# Patient Record
Sex: Female | Born: 1960 | Race: White | Hispanic: No | Marital: Married | Smoking: Never smoker
Health system: Southern US, Community
[De-identification: ages and names within clinical notes are randomized; demographics above are authoritative.]

## PROBLEM LIST (undated history)

## (undated) DIAGNOSIS — T7840XA Allergy, unspecified, initial encounter: Secondary | ICD-10-CM

## (undated) DIAGNOSIS — G43909 Migraine, unspecified, not intractable, without status migrainosus: Secondary | ICD-10-CM

## (undated) DIAGNOSIS — E785 Hyperlipidemia, unspecified: Secondary | ICD-10-CM

## (undated) DIAGNOSIS — K219 Gastro-esophageal reflux disease without esophagitis: Secondary | ICD-10-CM

## (undated) HISTORY — DX: Gastro-esophageal reflux disease without esophagitis: K21.9

## (undated) HISTORY — DX: Hyperlipidemia, unspecified: E78.5

## (undated) HISTORY — DX: Migraine, unspecified, not intractable, without status migrainosus: G43.909

## (undated) HISTORY — DX: Allergy, unspecified, initial encounter: T78.40XA

## (undated) HISTORY — PX: DILATION AND CURETTAGE OF UTERUS: SHX78

## (undated) HISTORY — PX: ABDOMINAL HYSTERECTOMY: SHX81

---

## 2000-07-21 ENCOUNTER — Encounter: Admission: RE | Admit: 2000-07-21 | Discharge: 2000-10-19 | Payer: Self-pay | Admitting: Family Medicine

## 2000-09-22 ENCOUNTER — Other Ambulatory Visit: Admission: RE | Admit: 2000-09-22 | Discharge: 2000-09-22 | Payer: Self-pay | Admitting: Obstetrics and Gynecology

## 2001-06-19 ENCOUNTER — Encounter: Admission: RE | Admit: 2001-06-19 | Discharge: 2001-09-17 | Payer: Self-pay | Admitting: Family Medicine

## 2001-09-26 ENCOUNTER — Other Ambulatory Visit: Admission: RE | Admit: 2001-09-26 | Discharge: 2001-09-26 | Payer: Self-pay | Admitting: Obstetrics and Gynecology

## 2002-11-05 ENCOUNTER — Other Ambulatory Visit: Admission: RE | Admit: 2002-11-05 | Discharge: 2002-11-05 | Payer: Self-pay | Admitting: Obstetrics and Gynecology

## 2003-11-24 ENCOUNTER — Other Ambulatory Visit: Admission: RE | Admit: 2003-11-24 | Discharge: 2003-11-24 | Payer: Self-pay | Admitting: Obstetrics and Gynecology

## 2005-03-03 ENCOUNTER — Other Ambulatory Visit: Admission: RE | Admit: 2005-03-03 | Discharge: 2005-03-03 | Payer: Self-pay | Admitting: Obstetrics and Gynecology

## 2005-06-15 ENCOUNTER — Ambulatory Visit (HOSPITAL_COMMUNITY): Admission: RE | Admit: 2005-06-15 | Discharge: 2005-06-15 | Payer: Self-pay | Admitting: Gastroenterology

## 2008-04-01 ENCOUNTER — Ambulatory Visit (HOSPITAL_COMMUNITY): Admission: RE | Admit: 2008-04-01 | Discharge: 2008-04-01 | Payer: Self-pay | Admitting: Family Medicine

## 2008-05-16 ENCOUNTER — Ambulatory Visit: Payer: Self-pay | Admitting: Internal Medicine

## 2008-05-16 DIAGNOSIS — G43909 Migraine, unspecified, not intractable, without status migrainosus: Secondary | ICD-10-CM | POA: Insufficient documentation

## 2008-05-16 DIAGNOSIS — R0989 Other specified symptoms and signs involving the circulatory and respiratory systems: Secondary | ICD-10-CM

## 2008-05-16 DIAGNOSIS — R0609 Other forms of dyspnea: Secondary | ICD-10-CM | POA: Insufficient documentation

## 2011-01-05 ENCOUNTER — Other Ambulatory Visit: Payer: Self-pay | Admitting: Obstetrics and Gynecology

## 2011-01-05 DIAGNOSIS — R928 Other abnormal and inconclusive findings on diagnostic imaging of breast: Secondary | ICD-10-CM

## 2011-01-11 ENCOUNTER — Ambulatory Visit
Admission: RE | Admit: 2011-01-11 | Discharge: 2011-01-11 | Disposition: A | Discharge status: Home / Self Care | Payer: BC Managed Care – PPO | Source: Ambulatory Visit | Attending: Obstetrics and Gynecology | Admitting: Obstetrics and Gynecology

## 2011-01-11 DIAGNOSIS — R928 Other abnormal and inconclusive findings on diagnostic imaging of breast: Secondary | ICD-10-CM

## 2011-03-04 NOTE — Op Note (Signed)
[name], Erica Rojas                ACCOUNT NO.:  [id number]   MEDICAL RECORD NO.:  [id number]          PATIENT TYPE:  AMB   LOCATION:  ENDO                         FACILITY:  [facility]   PHYSICIAN:  Anselmo Rod, M.D.  DATE OF BIRTH:  1961/02/11   DATE OF PROCEDURE:  06/15/2005  DATE OF DISCHARGE:                                 OPERATIVE REPORT   PROCEDURE PERFORMED:  Screening colonoscopy.   ENDOSCOPIST:  Anselmo Rod, M.D.   INSTRUMENT USED:  Olympus video colonoscope.   INDICATIONS FOR PROCEDURE:  A 50 year old white female with a family history  of colon cancer undergoing screening colonoscopy to rule out colonic polyps,  masses, etc.   PREPROCEDURE PREPARATION:  Informed consent was procured from the patient.  The patient fasted for eight hours prior to the procedure and prepped with a  bottle of magnesium citrate and a gallon of GoLYTELY the night prior to the  procedure.  Risks and benefits of the procedure including a 10% miss rate of  cancer and polyps was discussed with the patient as well.   PREPROCEDURE PHYSICAL:  VITAL SIGNS:  Stable vital signs.  NECK:  Supple.  CHEST:  Clear to auscultation.  CARDIOVASCULAR:  S1 and S2 regular.  ABDOMEN:  Soft with normal bowel sounds.   DESCRIPTION OF PROCEDURE:  The patient was placed in left lateral decubitus  position, sedated with 50 mcg of Fentanyl and 5 mg of Versed in slow  incremental doses.  Once the patient was adequately sedated and maintained  on low flow oxygen and continuous cardiac monitoring, the Olympus video  colonoscope was advanced from the rectum to the cecum.  The appendiceal  orifice and ileocecal valve were clearly visualized and photographed.  No  masses, polyps, erosions or ulcerations or diverticula  were seen.  Small  internal hemorrhoids were seen on retroflexion in the rectum.  The patient  tolerated the procedure well without complications.   IMPRESSION:  Normal colonoscopy up to the terminal  ileum except for small  nonbleeding internal hemorrhoids seen on retroflexion.   RECOMMENDATIONS:  1.  Continue a high fiber diet with liberal fluid intake.  2.  Repeat colonoscopy in the next 10 years unless the patient develops any      abnormal symptoms in the interim.  3.  Outpatient follow-up as need arises in the future.      Anselmo Rod, M.D.  Electronically Signed     JNM/MEDQ  D:  06/15/2005  T:  06/15/2005  Job:  [id number]   cc:   Caryn Bee L. Little, M.D.  [address]  [place]  [place] [address]  Fax: [phone]   Guy Sandifer. Henderson Cloud, M.D.  [address]  [place]  [place] [address]  Fax: [phone]

## 2011-11-02 ENCOUNTER — Ambulatory Visit (INDEPENDENT_AMBULATORY_CARE_PROVIDER_SITE_OTHER): Payer: BC Managed Care – PPO

## 2011-11-02 DIAGNOSIS — J309 Allergic rhinitis, unspecified: Secondary | ICD-10-CM

## 2011-11-02 DIAGNOSIS — H9319 Tinnitus, unspecified ear: Secondary | ICD-10-CM

## 2014-03-20 ENCOUNTER — Ambulatory Visit (INDEPENDENT_AMBULATORY_CARE_PROVIDER_SITE_OTHER): Payer: BC Managed Care – PPO | Admitting: Internal Medicine

## 2014-03-20 ENCOUNTER — Encounter: Payer: Self-pay | Admitting: Internal Medicine

## 2014-03-20 VITALS — BP 118/80 | HR 91 | Temp 98.4°F | Resp 12 | Ht 58.5 in | Wt 167.0 lb

## 2014-03-20 DIAGNOSIS — E0590 Thyrotoxicosis, unspecified without thyrotoxic crisis or storm: Secondary | ICD-10-CM

## 2014-03-20 LAB — T4, FREE: Free T4: 1.55 ng/dL (ref 0.60–1.60)

## 2014-03-20 LAB — T3, FREE: T3 FREE: 3.9 pg/mL (ref 2.3–4.2)

## 2014-03-20 LAB — TSH: TSH: 0.01 u[IU]/mL — ABNORMAL LOW (ref 0.35–4.50)

## 2014-03-20 NOTE — Progress Notes (Signed)
Patient ID: Erica Rojas, female   DOB: [birth year], 53 y.o.   MRN: [id number]   HPI  Erica Rojas is a 53 y.o.-year-old female, referred by her PCP, Dr. Orland Penman, in consultation for thyrotoxycosis.  She was seen by Dr Gaetano Net (obGyn) 1 mo ago >> screening TSH was low. She followed up in 1 mo with him. She started to have sxs retrospectively: mm and joint pain (neck, shoulders), also confusion, lack of concentration >> became worse >> went to walk-in clinic >> given an inj of steroid >> pain better >> then got in to see PCP >> checked TSH >> still low >> referred to endocrinology.  I reviewed pt's thyroid tests - per records from PCP: 03/12/2014: TSH 0.02, fT4 1.59 (0.61-1.12), fT3 4.48 (2.5-3.9); ESR 15; CRP 2.6 02/17/2014: TSH 0.016  01/15/2013: TSH 1.462 Pt denies feeling nodules in neck, hoarseness, dysphagia/odynophagia, SOB with lying down; she c/o: - + excessive sweating/heat intolerance alternating with cold intolerance - + HA - + insomnia >> resolved - R side of neck painful and hot >> radiating to shoulder >> improved - + confusion >> resolved - no tremors - + anxiety - + palpitations - + fatigue - + hyperdefecation alternating with constipation - no weight changes  Pt does not have a FH of thyroid ds. No FH of thyroid cancer. No h/o radiation tx to head or neck.  No seaweed or kelp, no recent contrast studies. No steroid use. No herbal supplements.   ROS: Constitutional: see HPI Eyes: no blurry vision, no xerophthalmia ENT: + sore throat, no nodules palpated in throat, no dysphagia/odynophagia, no hoarseness Cardiovascular: no CP/SOB/palpitations/leg swelling Respiratory: no cough/SOB Gastrointestinal: no N/V/+D/+C Musculoskeletal: + muscle/joint aches Skin: no rashes, + easy bruising Neurological: no tremors/numbness/tingling/dizziness, + HA Psychiatric: + depression/anxiety  Past Medical History  Diagnosis Date  . Migraines   . Allergy     seasonal  . GERD  (gastroesophageal reflux disease)   . Hyperlipidemia    Past Surgical History  Procedure Laterality Date  . Abdominal hysterectomy    . Dilation and curettage of uterus      multiple  . Cesarean section     History   Occupational History  . Self-employed   Social History Main Topics  . Smoking status: Never Smoker   . Smokeless tobacco: Not on file  . Alcohol Use: 1.0 oz/week    2 drink(s) per week  . Drug Use: No   Social History Narrative   Married: 2 children; son and daughter (36 yrs, 42 yrs)    Current Outpatient Rx  Name  Route  Sig  Dispense  Refill  . aspirin 81 MG tablet   Oral   Take 81 mg by mouth daily.         . Biotin (BIOTIN 5000) 5 MG CAPS   Oral   Take 2 capsules by mouth at bedtime.         . Calcium Carbonate-Vit D-Min (CALCIUM 1200 PO)   Oral   Take 1 tablet by mouth daily.         . fexofenadine (ALLEGRA) 180 MG tablet   Oral   Take 180 mg by mouth as needed for allergies or rhinitis.         Marland Kitchen glucosamine-chondroitin 500-400 MG tablet   Oral   Take 2 tablets by mouth daily at 10 pm.         . montelukast (SINGULAIR) 10 MG tablet   Oral   Take 10 mg  by mouth as needed.         . Multiple Vitamins-Minerals (CENTRUM ULTRA WOMENS) TABS   Oral   Take 1 tablet by mouth daily at 10 pm.         . Omega-3 Fatty Acids (FISH OIL) 1000 MG CAPS   Oral   Take 2 capsules by mouth daily.         Marland Kitchen omeprazole (PRILOSEC) 20 MG capsule   Oral   Take 20 mg by mouth daily.         . SUMAtriptan-naproxen (TREXIMET) 85-500 MG per tablet   Oral   Take 1 tablet by mouth as needed for migraine.         . Vitamin D, Ergocalciferol, (DRISDOL) 50000 UNITS CAPS capsule   Oral   Take 50,000 Units by mouth every 7 (seven) days.          Allergies  Allergen Reactions  . Demerol [Meperidine]   . Meperidine Hcl   . Monosodium Glutamate   . Morphine   . Novocain [Procaine]    Family History  Problem Relation Age of Onset  .  Heart disease Mother   . Alcohol abuse Mother   . Depression Mother     and panic disorder  . Cancer Father     lung cancer  . Hypertension Father   . Heart disease Father   . Heart disease Paternal Grandmother   . Cancer Paternal Grandmother     breast cancer  . Cancer Paternal Grandfather    PE: BP 118/80  Pulse 91  Temp(Src) 98.4 F (36.9 C) (Oral)  Resp 12  Ht 4' 10.5" (1.486 m)  Wt 167 lb (75.751 kg)  BMI 34.30 kg/m2  SpO2 91% Wt Readings from Last 3 Encounters:  03/20/14 167 lb (75.751 kg)  05/16/08 184 lb (83.462 kg)   Constitutional: overweight, in NAD Eyes: PERRLA, EOMI, no exophthalmos, no lid lag, no stare ENT: moist mucous membranes, no thyromegaly, no thyroid bruits, no cervical lymphadenopathy Cardiovascular: RRR, No MRG Respiratory: CTA B Gastrointestinal: abdomen soft, NT, ND, BS+ Musculoskeletal: no deformities, strength intact in all 4 Skin: moist, warm, no rashes Neurological: no tremor with outstretched hands, DTR normal in all 4  ASSESSMENT: 1. Thyrotoxycosis  PLAN:  1. Patient with recently found thyrotoxicosis, with thyrotoxic sxs: weight loss, heat intolerance, hyperdefecation, palpitations, anxiety, which are improving. - she does not appear to have exogenous causes for the low TSH.  - We discussed that possible causes of thyrotoxicosis are:  Graves ds   Thyroiditis toxic multinodular goiter/ toxic adenoma (I cannot feel nodules at palpation of her thyroid). - I suggested that we check the TSH, fT3 and fT4 and also had thyroid stimulating antibodies to screen for Graves' disease.  - If the tests remain abnormal, we may need to obtain an uptake and scan to differentiate between the 3 above possible etiologies  - we discussed about possible modalities of treatment for the above conditions, to include methimazole use, radioactive iodine ablation or surgery. - we can also start provisional MMI 5 mg bid after results are back, depending on the  levels and she needs to stop MMI 4 days prior to the Uptake and scan - she agrees to plan. I explained possible SEs of MMI in case we start it. - I did advice her that we might need to do thyroid ultrasound depending on the results of the uptake and scan (if a cold nodule is present) - I do not  feel that we need to add beta blockers at this time, since she is not tachycardic, anxious, or tremulous - I advised her to join my chart to communicate easier - RTC in 3 months, but likely sooner for repeat labs  Office Visit on 03/20/2014  Component Date Value Ref Range Status  . TSH 03/20/2014 0.01* 0.35 - 4.50 uIU/mL Final  . Free T4 03/20/2014 1.55  0.60 - 1.60 ng/dL Final  . T3, Free 03/20/2014 3.9  2.3 - 4.2 pg/mL Final  . TSI 03/20/2014 118  <140 % baseline Final  TSI not elevated, unlikely Graves ds.  TSH still low, but free T3 and free T4 normalized >> will repeat TFTs in 6 weeks. This could have been an episode of mild thyroiditis.

## 2014-03-20 NOTE — Patient Instructions (Signed)
Please stop at the lab. Please return in 3 months for a visit.   Hyperthyroidism The thyroid is a large gland located in the lower front part of your neck. The thyroid helps control metabolism. Metabolism is how your body uses food. It controls metabolism with the hormone thyroxine. When the thyroid is overactive, it produces too much hormone. When this happens, these following problems may occur:   Nervousness  Heat intolerance  Weight loss (in spite of increase food intake)  Diarrhea  Change in hair or skin texture  Palpitations (heart skipping or having extra beats)  Tachycardia (rapid heart rate)  Loss of menstruation (amenorrhea)  Shaking of the hands CAUSES  Grave's Disease (the immune system attacks the thyroid gland). This is the most common cause.  Inflammation of the thyroid gland.  Tumor (usually benign) in the thyroid gland or elsewhere.  Excessive use of thyroid medications (both prescription and 'natural').  Excessive ingestion of Iodine. DIAGNOSIS  To prove hyperthyroidism, your caregiver may do blood tests and ultrasound tests. Sometimes the signs are hidden. It may be necessary for your caregiver to watch this illness with blood tests, either before or after diagnosis and treatment. TREATMENT Short-term treatment There are several treatments to control symptoms. Drugs called beta blockers may give some relief. Drugs that decrease hormone production will provide temporary relief in many people. These measures will usually not give permanent relief. Definitive therapy There are treatments available which can be discussed between you and your caregiver which will permanently treat the problem. These treatments range from surgery (removal of the thyroid), to the use of radioactive iodine (destroys the thyroid by radiation), to the use of antithyroid drugs (interfere with hormone synthesis). The first two treatments are permanent and usually successful. They most  often require hormone replacement therapy for life. This is because it is impossible to remove or destroy the exact amount of thyroid required to make a person euthyroid (normal). HOME CARE INSTRUCTIONS  See your caregiver if the problems you are being treated for get worse. Examples of this would be the problems listed above. SEEK MEDICAL CARE IF: Your general condition worsens. MAKE SURE YOU:   Understand these instructions.  Will watch your condition.  Will get help right away if you are not doing well or get worse. Document Released: 10/03/2005 Document Revised: 12/26/2011 Document Reviewed: 02/14/2007 [facility] Patient Information 2014 [place], [place].

## 2014-03-23 LAB — THYROID STIMULATING IMMUNOGLOBULIN: TSI: 118 % baseline (ref <140)

## 2014-06-20 ENCOUNTER — Other Ambulatory Visit (INDEPENDENT_AMBULATORY_CARE_PROVIDER_SITE_OTHER): Payer: BC Managed Care – PPO

## 2014-06-20 ENCOUNTER — Ambulatory Visit: Payer: BC Managed Care – PPO | Admitting: Internal Medicine

## 2014-06-20 DIAGNOSIS — E0590 Thyrotoxicosis, unspecified without thyrotoxic crisis or storm: Secondary | ICD-10-CM

## 2014-06-20 LAB — T4, FREE: Free T4: 1.15 ng/dL (ref 0.60–1.60)

## 2014-06-20 LAB — T3, FREE: T3, Free: 3.4 pg/mL (ref 2.3–4.2)

## 2014-06-20 LAB — TSH: TSH: 0.02 u[IU]/mL — AB (ref 0.35–4.50)

## 2014-06-24 ENCOUNTER — Ambulatory Visit (INDEPENDENT_AMBULATORY_CARE_PROVIDER_SITE_OTHER): Payer: BC Managed Care – PPO | Admitting: Internal Medicine

## 2014-06-24 ENCOUNTER — Encounter: Payer: Self-pay | Admitting: Internal Medicine

## 2014-06-24 ENCOUNTER — Other Ambulatory Visit: Payer: Self-pay | Admitting: Internal Medicine

## 2014-06-24 VITALS — BP 104/66 | HR 87 | Temp 98.3°F | Resp 12 | Wt 164.0 lb

## 2014-06-24 DIAGNOSIS — E0590 Thyrotoxicosis, unspecified without thyrotoxic crisis or storm: Secondary | ICD-10-CM

## 2014-06-24 NOTE — Progress Notes (Signed)
Patient ID: Syleena Mchan, female   DOB: 04-Jun-1961, 53 y.o.   MRN: [id number]   HPI  Klaudia Mura is a 53 y.o.-year-old female, returning for f/u for thyrotoxycosis. Last visit 3 mo ago.  Reviewed hx: She was seen by Dr Gaetano Net (obGyn) 1 mo ago >> screening TSH was low. She followed up in 1 mo with him. She started to have sxs retrospectively: mm and joint pain (neck, shoulders), also confusion, lack of concentration >> became worse >> went to walk-in clinic >> given an inj of steroid >> pain better >> then got in to see PCP >> checked TSH >> still low >> referred to endocrinology.  I reviewed pt's thyroid tests: Component     Latest Ref Rng 03/20/2014 06/20/2014  TSH     0.35 - 4.50 uIU/mL 0.01 (L) 0.02 (L)  Free T4     0.60 - 1.60 ng/dL 1.55 1.15  T3, Free     2.3 - 4.2 pg/mL 3.9 3.4  TSI     <140 % baseline 118    03/12/2014: TSH 0.02, fT4 1.59 (0.61-1.12), fT3 4.48 (2.5-3.9); ESR 15; CRP 2.6 02/17/2014: TSH 0.016  01/15/2013: TSH 1.462  Pt denies feeling nodules in neck, hoarseness, dysphagia/odynophagia, SOB with lying down; she does not have many sxs that can be related to her subclinical hyperthyroidism - she c/o: - + some heat intolerance >> but improved - + HA - resolved insomnia >> resolved - R side of neck painful and hot >> radiating to shoulder >> improved - resolved confusion - no tremors - + anxiety - resolved  palpitations - no fatigue - + occasional constipation - no weight changes  Pt does not have a FH of thyroid ds. No FH of thyroid cancer. No h/o radiation tx to head or neck.   I reviewed pt's medications, allergies, PMH, social hx, family hx and no changes required, except as mentioned above. She started Ergocalciferol 50,000 units weekly.  ROS: Constitutional: see HPI, + nocturia Eyes: no blurry vision, no xerophthalmia ENT: no sore throat, no nodules palpated in throat, no dysphagia/odynophagia, no hoarseness Cardiovascular: no CP/SOB/palpitations/leg  swelling Respiratory: no cough/SOB Gastrointestinal: no N/V/D/+C Musculoskeletal: no muscle/joint aches Skin: no rashes, + easy bruising Neurological: no tremors/numbness/tingling/dizziness, HA  PE: BP 104/66  Pulse 87  Temp(Src) 98.3 F (36.8 C) (Oral)  Resp 12  Wt 164 lb (74.39 kg)  SpO2 98% Wt Readings from Last 3 Encounters:  06/24/14 164 lb (74.39 kg)  03/20/14 167 lb (75.751 kg)  05/16/08 184 lb (83.462 kg)   Constitutional: overweight, in NAD Eyes: PERRLA, EOMI, no exophthalmos, no lid lag, no stare ENT: moist mucous membranes, no thyromegaly, no thyroid bruits, no cervical lymphadenopathy Cardiovascular: RRR, No MRG Respiratory: CTA B Gastrointestinal: abdomen soft, NT, ND, BS+ Musculoskeletal: no deformities, strength intact in all 4 Skin: moist, warm, no rashes Neurological: no tremor with outstretched hands, DTR normal in all 4  ASSESSMENT: 1. Thyrotoxycosis  PLAN:  1. Patient with recently found thyrotoxicosis, without thyrotoxic sxs now, but with persistent low TSH after 3 months. - she does not appear to have exogenous causes for the low TSH.  - We again discussed that possible causes of thyrotoxicosis are:  Graves ds   Thyroiditis toxic multinodular goiter/ toxic adenoma (I cannot feel nodules at palpation of her thyroid).  - since the tests remained abnormal, we will obtain an uptake and scan to differentiate between the 3 above possible etiologies. I explained what the test entails. - I  do not feel that we need to add beta blockers at this time, since she is not tachycardic, anxious, or tremulous - RTC in 3 months for labs but in 6 mo for another visit   CLINICAL DATA: Thyrotoxicosis  EXAM: THYROID SCAN AND UPTAKE - 24 HOURS  TECHNIQUE: Following the per oral administration of I-131 sodium iodide, the patient returned at 24 hours and uptake measurements were acquired with the uptake probe centered on the neck. Thyroid imaging was performed  following the intravenous administration of the Tc-37m Pertechnetate.  RADIOPHARMACEUTICALS: 7.5 microCuries I-131 Sodium Iodide and 10.8 mCi TC-39m Pertechnetate  COMPARISON: None  FINDINGS: The 24 hr radioactive iodine uptake is equal to 29.3%  There is diffuse radiotracer uptake throughout both lobes of the thyroid gland. No dominant hot or cold nodules identified.  IMPRESSION: 1. Mildly elevated 24 hr radioactive iodine uptake. 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  2. Normal thyroid scan.   Electronically Signed By: Kerby Moors M.D. On: 07/08/2014 09:30     Very mild thyroid hyperactivity >> will continue to monitor. Check labs in 3 months as discussed.

## 2014-06-24 NOTE — Patient Instructions (Signed)
Please come back for labs in 3 months and for a visit in 6 months. We will schedule the Uptake and scan and will call you with the schedule.

## 2014-06-27 ENCOUNTER — Telehealth: Payer: Self-pay | Admitting: *Deleted

## 2014-06-27 NOTE — Telephone Encounter (Signed)
Uptake and scan scheduled for Monday, Sept 21st at 8:00 am (arrival time 7:45 am) and Tuesday, Sept 22nd at 8:00 am (arrival time 7:45 am). Called pt and lvm advising her of the dates and times. Advised pt to call with any questions. Letter mailed to pt as well.

## 2014-07-07 ENCOUNTER — Encounter (HOSPITAL_COMMUNITY)
Admission: RE | Admit: 2014-07-07 | Discharge: 2014-07-07 | Disposition: A | Discharge status: Home / Self Care | Payer: BC Managed Care – PPO | Source: Ambulatory Visit | Attending: Internal Medicine | Admitting: Internal Medicine

## 2014-07-07 DIAGNOSIS — E0590 Thyrotoxicosis, unspecified without thyrotoxic crisis or storm: Secondary | ICD-10-CM | POA: Insufficient documentation

## 2014-07-08 ENCOUNTER — Encounter (HOSPITAL_COMMUNITY)
Admission: RE | Admit: 2014-07-08 | Discharge: 2014-07-08 | Disposition: A | Discharge status: Home / Self Care | Payer: BC Managed Care – PPO | Source: Ambulatory Visit | Attending: Internal Medicine | Admitting: Internal Medicine

## 2014-07-08 DIAGNOSIS — E0590 Thyrotoxicosis, unspecified without thyrotoxic crisis or storm: Secondary | ICD-10-CM | POA: Diagnosis not present

## 2014-07-08 MED ORDER — SODIUM PERTECHNETATE TC 99M INJECTION
10.8000 | Freq: Once | INTRAVENOUS | Status: AC | PRN
Start: 2014-07-08 — End: 2014-07-08
  Administered 2014-07-08: 11 via INTRAVENOUS

## 2014-07-08 MED ORDER — SODIUM IODIDE I 131 CAPSULE
7.3000 | Freq: Once | INTRAVENOUS | Status: AC
  Administered 2014-07-07: 7.3 via ORAL

## 2014-09-23 ENCOUNTER — Ambulatory Visit (INDEPENDENT_AMBULATORY_CARE_PROVIDER_SITE_OTHER): Payer: BC Managed Care – PPO | Admitting: Internal Medicine

## 2014-09-23 ENCOUNTER — Encounter: Payer: Self-pay | Admitting: Internal Medicine

## 2014-09-23 VITALS — BP 122/72 | HR 76 | Temp 98.1°F | Resp 12 | Wt 162.8 lb

## 2014-09-23 DIAGNOSIS — E0590 Thyrotoxicosis, unspecified without thyrotoxic crisis or storm: Secondary | ICD-10-CM

## 2014-09-23 LAB — T4, FREE: FREE T4: 1.2 ng/dL (ref 0.60–1.60)

## 2014-09-23 LAB — T3, FREE: T3, Free: 4.2 pg/mL (ref 2.3–4.2)

## 2014-09-23 LAB — TSH: TSH: 0.03 u[IU]/mL — ABNORMAL LOW (ref 0.35–4.50)

## 2014-09-23 NOTE — Progress Notes (Signed)
Patient ID: Erica Rojas, female   DOB: 1961/08/30, 53 y.o.   MRN: [id number]   HPI  Heimdal is a 53 y.o.-year-old female, returning for f/u for thyrotoxicosis. Last visit 3 mo ago.  Reviewed hx: She was seen by Dr Gaetano Net (obGyn) 1 mo ago >> screening TSH was low. She followed up in 1 mo with him. She started to have sxs retrospectively: mm and joint pain (neck, shoulders), also confusion, lack of concentration >> became worse >> went to walk-in clinic >> given an inj of steroid >> pain better >> then got in to see PCP >> checked TSH >> still low >> referred to endocrinology.  I reviewed pt's thyroid tests: Component     Latest Ref Rng 03/20/2014 06/20/2014  TSH     0.35 - 4.50 uIU/mL 0.01 (L) 0.02 (L)  Free T4     0.60 - 1.60 ng/dL 1.55 1.15  T3, Free     2.3 - 4.2 pg/mL 3.9 3.4  TSI     <140 % baseline 118    03/12/2014: TSH 0.02, fT4 1.59 (0.61-1.12), fT3 4.48 (2.5-3.9); ESR 15; CRP 2.6 02/17/2014: TSH 0.016  01/15/2013: TSH 1.462  Uptake and scan 07/08/2014: 29.3% uptake, uniform  Pt denies feeling nodules in neck, hoarseness, dysphagia/odynophagia, SOB with lying down; she does not have many sxs that can be related to her subclinical hyperthyroidism - she c/o: - + some heat intolerance (hysterectomy 2014), does not feel like these are hot flushes - resolved HA - resolved insomnia  - resolved R side of neck painful and hot >> radiating to shoulder  - no tremors - + anxiety - resolved  palpitations - no fatigue - + occasional constipation - no weight changes  I reviewed pt's medications, allergies, PMH, social hx, family hx and no changes required, except as mentioned above. She started 2x5000 IU a day OTC vitamin D.   She stopped yoga/exercise as she has been very stressed >> daughter attempted suicide in 06/2014. She is now better, lives with pt.  ROS: Constitutional: see HPI, + nocturia Eyes: no blurry vision, no xerophthalmia ENT: no sore throat, no nodules  palpated in throat, no dysphagia/odynophagia, no hoarseness Cardiovascular: no CP/SOB/palpitations/leg swelling Respiratory: no cough/SOB Gastrointestinal: no N/V/D/+C/+ heartburn Musculoskeletal: no muscle/joint aches Skin: no rashes, + easy bruising Neurological: no tremors/numbness/tingling/dizziness, HA  PE: BP 122/72 mmHg  Pulse 76  Temp(Src) 98.1 F (36.7 C) (Oral)  Resp 12  Wt 162 lb 12.8 oz (73.846 kg)  SpO2 97% Body mass index is 33.44 kg/(m^2).  Wt Readings from Last 3 Encounters:  09/23/14 162 lb 12.8 oz (73.846 kg)  06/24/14 164 lb (74.39 kg)  03/20/14 167 lb (75.751 kg)   Constitutional: overweight, in NAD Eyes: PERRLA, EOMI, no exophthalmos, no lid lag, no stare ENT: moist mucous membranes, no thyromegaly, no cervical lymphadenopathy Cardiovascular: RRR, No MRG Respiratory: CTA B Gastrointestinal: abdomen soft, NT, ND, BS+ Musculoskeletal: no deformities, strength intact in all 4 Skin: moist, warm, no rashes Neurological: no tremor with outstretched hands, DTR normal in all 4  ASSESSMENT: 1. Graves disease - mild  PLAN:  1. Patient h/o mild thyrotoxicosis, without thyrotoxic sxs now except slight hyperthermia, and with persistent low TSH. Free thyroid hormones are normal. - we will check the labs today and see if we need to start [place] when the labs are back. If same or better, we will just follow them. - we need to add beta blockers at this time, since she  is not tachycardic, anxious, or tremulous - RTC in 6 mo for another visit   Office Visit on 09/23/2014  Component Date Value Ref Range Status  . T3, Free 09/23/2014 4.2  2.3 - 4.2 pg/mL Final  . Free T4 09/23/2014 1.20  0.60 - 1.60 ng/dL Final  . TSH 09/23/2014 0.03* 0.35 - 4.50 uIU/mL Final   TSH inching up. Recheck tests in 6 months. Pt was advised about sxs of hyperthyroidism and will call me if she develops them so we can repeat the thyroid tests.

## 2014-09-23 NOTE — Patient Instructions (Signed)
Please stop at the lab.  Please come back for a follow-up appointment in 6 months.  

## 2015-03-06 ENCOUNTER — Encounter: Payer: Self-pay | Admitting: Internal Medicine

## 2015-03-06 ENCOUNTER — Other Ambulatory Visit: Payer: Self-pay | Admitting: *Deleted

## 2015-03-06 DIAGNOSIS — E0590 Thyrotoxicosis, unspecified without thyrotoxic crisis or storm: Secondary | ICD-10-CM

## 2015-03-25 ENCOUNTER — Encounter: Payer: Self-pay | Admitting: Internal Medicine

## 2015-03-25 ENCOUNTER — Ambulatory Visit (INDEPENDENT_AMBULATORY_CARE_PROVIDER_SITE_OTHER): Payer: BLUE CROSS/BLUE SHIELD | Admitting: Internal Medicine

## 2015-03-25 VITALS — BP 108/62 | HR 100 | Temp 98.0°F | Resp 12 | Wt 164.0 lb

## 2015-03-25 DIAGNOSIS — E0590 Thyrotoxicosis, unspecified without thyrotoxic crisis or storm: Secondary | ICD-10-CM | POA: Diagnosis not present

## 2015-03-25 DIAGNOSIS — R351 Nocturia: Secondary | ICD-10-CM

## 2015-03-25 NOTE — Patient Instructions (Signed)
Please return in 10 days for labs. Please come back for a follow-up appointment in 4 months.

## 2015-03-25 NOTE — Progress Notes (Addendum)
Patient ID: Erica Rojas, female   DOB: [birth year], 54 y.o.   MRN: [id number]   HPI  Erica Rojas is a 54 y.o.-year-old female, returning for f/u for thyrotoxicosis. Last visit 6 mo ago. She did not return for labs earlier.  Please had poison oak allergy rxn in 02/2015. She got a steroid inj followed by a taper.   She then had a hair follicular abscess >> ABx.  Reviewed hx: She was seen by Dr Erica Rojas (obGyn) 1 mo ago >> screening TSH was low. She followed up in 1 mo with him. She started to have sxs retrospectively: mm and joint pain (neck, shoulders), also confusion, lack of concentration >> became worse >> went to walk-in clinic >> given an inj of steroid >> pain better >> then got in to see PCP >> checked TSH >> still low >> referred to endocrinology.  I reviewed pt's thyroid tests: Component     Latest Ref Rng 03/20/2014 06/20/2014 09/23/2014  TSH     0.35 - 4.50 uIU/mL 0.01 (L) 0.02 (L) 0.03 (L)  Free T4     0.60 - 1.60 ng/dL 1.55 1.15 1.20  T3, Free     2.3 - 4.2 pg/mL 3.9 3.4 4.2  TSI     <140 % baseline 118     03/12/2014: TSH 0.02, fT4 1.59 (0.61-1.12), fT3 4.48 (2.5-3.9); ESR 15; CRP 2.6 02/17/2014: TSH 0.016  01/15/2013: TSH 1.462  Uptake and scan 07/08/2014: 29.3% uptake (nl 10-30%), uniform  Pt denies feeling nodules in neck, hoarseness, dysphagia/odynophagia, SOB with lying down.  She was on Biotin, now off x 4 mo.  She c/o: - + some heat intolerance (hysterectomy 2014), does not feel like these are hot flushes - + nocturia x4 - + heat intolerance. - resolved tremors - no tremors - + anxiety - + palpitations (especially on Prednisone taper for the poison oak. - no fatigue - + easy bruising - no weight changes  I reviewed pt's medications, allergies, PMH, social hx, family hx, and changes were documented in the history of present illness. Otherwise, unchanged from my initial visit note.  Daughter attempted suicide in 06/2014. She is now better, lives with  pt.  ROS: Constitutional: see HPI, + nocturia Eyes: no blurry vision, no xerophthalmia ENT: no sore throat, no nodules palpated in throat, no dysphagia/odynophagia, no hoarseness Cardiovascular: no CP/SOB/palpitations/leg swelling Respiratory: no cough/SOB Gastrointestinal: no N/V/D/C/+ heartburn Musculoskeletal: no muscle/joint aches Skin: no rashes, + easy bruising Neurological: no tremors/numbness/tingling/dizziness, HA  PE: BP 108/62 mmHg  Pulse 100  Temp(Src) 98 F (36.7 C) (Oral)  Resp 12  Wt 164 lb (74.39 kg)  SpO2 98% Body mass index is 33.69 kg/(m^2).  Wt Readings from Last 3 Encounters:  03/25/15 164 lb (74.39 kg)  09/23/14 162 lb 12.8 oz (73.846 kg)  06/24/14 164 lb (74.39 kg)   Constitutional: overweight, in NAD Eyes: PERRLA, EOMI, no exophthalmos, no lid lag, no stare ENT: moist mucous membranes, no thyromegaly, no cervical lymphadenopathy Cardiovascular: tachycardia, RR, No MRG Respiratory: CTA B Gastrointestinal: abdomen soft, NT, ND, BS+ Musculoskeletal: no deformities, strength intact in all 4 Skin: moist, warm, no rashes Neurological: no tremor with outstretched hands, DTR normal in all 4  ASSESSMENT: 1. Low TSH - ? Graves disease  2. Nocturia  PLAN:  1. Patient h/o mild thyrotoxicosis, with some thyrotoxic sxs :hyperthermia, occasional palpitations and tremors. She has anxiety >> not new. Pulse elevated today. Free thyroid hormones are normal. - we will check the  labs in 10 days (after she recovers from Prednisone dosing,  and see if we need to start Andrews when the labs are back. If same or better, we will just follow them. - RTC in 4 mo for another visit   2. Nocturia - will check a HbA1c with next labs  Component     Latest Ref Rng 03/20/2014 06/20/2014 09/23/2014 04/03/2015  TSH     0.35 - 4.50 uIU/mL 0.01 (L) 0.02 (L) 0.03 (L) 0.06 (L)  Free T4     0.60 - 1.60 ng/dL 1.55 1.15 1.20 1.18  Hemoglobin A1C     4.6 - 6.5 %    5.6   Hemoglobin A1c  normal, no signs of prediabetes or diabetes. TSH and free T4 continue to improve, despite a recent prednisone taper.  We'll continue to monitor for now off methimazole, especially in the setting of a normal uptake and scan. I will recheck her thyroid tests in 3 months, when she returns for another visit.

## 2015-04-01 ENCOUNTER — Other Ambulatory Visit: Payer: Self-pay | Admitting: Obstetrics and Gynecology

## 2015-04-01 DIAGNOSIS — R928 Other abnormal and inconclusive findings on diagnostic imaging of breast: Secondary | ICD-10-CM

## 2015-04-03 ENCOUNTER — Other Ambulatory Visit (INDEPENDENT_AMBULATORY_CARE_PROVIDER_SITE_OTHER): Payer: BLUE CROSS/BLUE SHIELD

## 2015-04-03 DIAGNOSIS — R351 Nocturia: Secondary | ICD-10-CM | POA: Diagnosis not present

## 2015-04-03 DIAGNOSIS — E0590 Thyrotoxicosis, unspecified without thyrotoxic crisis or storm: Secondary | ICD-10-CM

## 2015-04-03 LAB — TSH: TSH: 0.06 u[IU]/mL — ABNORMAL LOW (ref 0.35–4.50)

## 2015-04-03 LAB — T4, FREE: Free T4: 1.18 ng/dL (ref 0.60–1.60)

## 2015-04-03 LAB — HEMOGLOBIN A1C: Hgb A1c MFr Bld: 5.6 % (ref 4.6–6.5)

## 2015-04-07 ENCOUNTER — Ambulatory Visit
Admission: RE | Admit: 2015-04-07 | Discharge: 2015-04-07 | Disposition: A | Discharge status: Home / Self Care | Payer: BLUE CROSS/BLUE SHIELD | Source: Ambulatory Visit | Attending: Obstetrics and Gynecology | Admitting: Obstetrics and Gynecology

## 2015-04-07 DIAGNOSIS — R928 Other abnormal and inconclusive findings on diagnostic imaging of breast: Secondary | ICD-10-CM

## 2015-04-22 NOTE — Addendum Note (Signed)
Addended by: Carlus PavlovGHERGHE, Suad Autrey on: 04/22/2015 07:44 AM   Modules accepted: Level of Service

## 2015-08-07 ENCOUNTER — Ambulatory Visit (INDEPENDENT_AMBULATORY_CARE_PROVIDER_SITE_OTHER): Payer: BLUE CROSS/BLUE SHIELD | Admitting: Internal Medicine

## 2015-08-07 ENCOUNTER — Other Ambulatory Visit (INDEPENDENT_AMBULATORY_CARE_PROVIDER_SITE_OTHER): Payer: BLUE CROSS/BLUE SHIELD | Admitting: *Deleted

## 2015-08-07 ENCOUNTER — Ambulatory Visit: Payer: BLUE CROSS/BLUE SHIELD | Admitting: Internal Medicine

## 2015-08-07 ENCOUNTER — Encounter: Payer: Self-pay | Admitting: Internal Medicine

## 2015-08-07 VITALS — BP 118/72 | HR 90 | Temp 98.4°F | Resp 12 | Wt 166.0 lb

## 2015-08-07 DIAGNOSIS — E041 Nontoxic single thyroid nodule: Secondary | ICD-10-CM | POA: Diagnosis not present

## 2015-08-07 DIAGNOSIS — Z23 Encounter for immunization: Secondary | ICD-10-CM

## 2015-08-07 DIAGNOSIS — E0590 Thyrotoxicosis, unspecified without thyrotoxic crisis or storm: Secondary | ICD-10-CM | POA: Diagnosis not present

## 2015-08-07 LAB — T4, FREE: FREE T4: 1.2 ng/dL (ref 0.60–1.60)

## 2015-08-07 LAB — TSH: TSH: 0.07 u[IU]/mL — AB (ref 0.35–4.50)

## 2015-08-07 LAB — T3, FREE: T3 FREE: 3.8 pg/mL (ref 2.3–4.2)

## 2015-08-07 NOTE — Patient Instructions (Signed)
Please stop at the lab.  We will call you with the Ultrasound schedule.  Please come back for a follow-up appointment in 6 months.

## 2015-08-07 NOTE — Progress Notes (Addendum)
Patient ID: Erica Rojas, female   DOB: 1961/02/27, 54 y.o.   MRN: [id number]   HPI  Erica Rojas is a 54 y.o.-year-old female, returning for f/u for subclinical thyrotoxicosis. Last visit 6 mo ago.   Reviewed hx: She was seen by Dr Erica Rojas (obGyn) 1 mo ago >> screening TSH was low. She followed up in 1 mo with him. She started to have sxs retrospectively: mm and joint pain (neck, shoulders), also confusion, lack of concentration >> became worse >> went to walk-in clinic >> given an inj of steroid >> pain better >> then got in to see PCP >> checked TSH >> still low >> referred to endocrinology.  I reviewed pt's thyroid tests: Component     Latest Ref Rng 03/20/2014 06/20/2014 09/23/2014 04/03/2015  TSH     0.35 - 4.50 uIU/mL 0.01 (L) 0.02 (L) 0.03 (L) 0.06 (L)  Free T4     0.60 - 1.60 ng/dL 1.55 1.15 1.20 1.18  Hemoglobin A1C     4.6 - 6.5 %    5.6   03/12/2014: TSH 0.02, fT4 1.59 (0.61-1.12), fT3 4.48 (2.5-3.9); ESR 15; CRP 2.6 02/17/2014: TSH 0.016  01/15/2013: TSH 1.462  Uptake and scan 07/08/2014: 29.3% uptake (nl 10-30%), uniform  Pt denies feeling nodules in neck, hoarseness, dysphagia/odynophagia, SOB with lying down.  She was on Biotin, now off.  She c/o: - + some heat intolerance (hysterectomy 2014), mostly at night >> sweating, also - resolved tremors - no tremors - improved anxiety - no palpitations  - no fatigue - no easy bruising - no weight changes  I reviewed pt's medications, allergies, PMH, social hx, family hx, and changes were documented in the history of present illness. Otherwise, unchanged from my initial visit note.  Daughter attempted suicide in 06/2014. She is now better, lives with pt.  ROS: Constitutional: see HPI, + nocturia x2 Eyes: no blurry vision, no xerophthalmia ENT: no sore throat, no nodules palpated in throat, no dysphagia/odynophagia, no hoarseness Cardiovascular: no CP/SOB/palpitations/leg swelling Respiratory: no  cough/SOB Gastrointestinal: no N/V/D/no C/+ heartburn Musculoskeletal: no muscle/joint aches Skin: no rashes, no easy bruising Neurological: no tremors/numbness/tingling/dizziness, HA  PE: BP 118/72 mmHg  Pulse 90  Temp(Src) 98.4 F (36.9 C) (Oral)  Resp 12  Wt 166 lb (75.297 kg)  SpO2 95% Body mass index is 34.1 kg/(m^2).  Wt Readings from Last 3 Encounters:  08/07/15 166 lb (75.297 kg)  03/25/15 164 lb (74.39 kg)  09/23/14 162 lb 12.8 oz (73.846 kg)   Constitutional: overweight, in NAD Eyes: PERRLA, EOMI, no exophthalmos, no lid lag, no stare ENT: moist mucous membranes, R > L thyroid fullness (? Thyroid nodule), no cervical lymphadenopathy Cardiovascular: RRR, No MRG Respiratory: CTA B Gastrointestinal: abdomen soft, NT, ND, BS+ Musculoskeletal: no deformities, strength intact in all 4 Skin: moist, warm, no rashes Neurological: no tremor with outstretched hands, DTR normal in all 4  ASSESSMENT: 1. Subclinical hyperthyroidism - 07/08/2014: Upper normal thyroid uptake (29.3%) and uniform scan. - likely very mild Graves disease  2. Left thyroid fullness - ? Thyroid nodule  PLAN:  1. Patient h/o mild thyrotoxicosis, with some thyrotoxic sxs :hyperthermia, occasional palpitations and tremors, now all resolved, except hot flashes at night. She had anxiety >> resolved. - I reviewed previous TFTs along with the patient: Free thyroid hormones are normal, while the TSH is slowly improving but still low. Will recheck TFTs today - we held off adding methimazole, based on the nonspecific symptoms, improving thyroid tests, and  normal uptake and scan. - At this visit, she reports feeling better, so no need for methimazole, but will await the results of the TFTs. - RTC in 6 mo for another visit   2. Left thyroid fullness - On palpation, I feel a possible thyroid nodule in her left lobe. This is not hot or cold per review of the uptake and scan in 06/2014. - No neck compression  symptoms - Will check a thyroid ultrasound. We discussed about the possibility of obtaining a thyroid biopsy if she has a nodule. She agrees with this.    Orders Placed This Encounter  Procedures  . US Soft Tissue Head/Neck  . TSH  . T4, free  . T3, free   Office Visit on 08/07/2015  Component Date Value Ref Range Status  . TSH 08/07/2015 0.07* 0.35 - 4.50 uIU/mL Final  . Free T4 08/07/2015 1.20  0.60 - 1.60 ng/dL Final  . T3, Free 08/07/2015 3.8  2.3 - 4.2 pg/mL Final   Message sent: Dear Ms Erica Rojas, The TSH is only slightly better compared to last time. The rest of the labs are normal. Let's continue to keep an eye on the thyroid tests, but no intervention is needed for now, as we discussed today. Sincerely, Erica Kingdom MD   CLINICAL DATA: 54 year old female with a history of left thyroid nodules  EXAM: THYROID ULTRASOUND  TECHNIQUE: Ultrasound examination of the thyroid gland and adjacent soft tissues was performed.  COMPARISON: Nuclear medicine study 07/08/2014  FINDINGS: Right thyroid lobe  Measurements: 3.7 cm x 1.3 cm x 1.6 cm. Heterogeneous appearance of the right thyroid tissue without significantly increased flow.  Left thyroid lobe  Measurements: 3.3 cm x 1.5 cm x 1.6 cm. Heterogeneous appearance of left thyroid tissue without significantly increased flow.  Isthmus  Thickness: 2 mm. No nodules visualized.  Lymphadenopathy  None visualized.  IMPRESSION: Heterogeneous appearance of the thyroid without focal nodules. Appearance may represent spectrum of thyroiditis.  Signed,  Erica Rojas. Erica Newport, DO  [facility]  Erica Rojas   Electronically Signed By: Erica Rojas D.O. On: 08/14/2015 13:34  No thyroid nodules.

## 2015-08-14 ENCOUNTER — Ambulatory Visit
Admission: RE | Admit: 2015-08-14 | Discharge: 2015-08-14 | Disposition: A | Discharge status: Home / Self Care | Payer: BLUE CROSS/BLUE SHIELD | Source: Ambulatory Visit | Attending: Internal Medicine | Admitting: Internal Medicine

## 2015-09-01 ENCOUNTER — Other Ambulatory Visit: Payer: Self-pay | Admitting: Obstetrics and Gynecology

## 2015-09-01 DIAGNOSIS — R921 Mammographic calcification found on diagnostic imaging of breast: Secondary | ICD-10-CM

## 2015-10-07 ENCOUNTER — Ambulatory Visit
Admission: RE | Admit: 2015-10-07 | Discharge: 2015-10-07 | Disposition: A | Discharge status: Home / Self Care | Payer: BLUE CROSS/BLUE SHIELD | Source: Ambulatory Visit | Attending: Obstetrics and Gynecology | Admitting: Obstetrics and Gynecology

## 2015-10-07 ENCOUNTER — Other Ambulatory Visit: Payer: Self-pay | Admitting: Obstetrics and Gynecology

## 2015-10-07 DIAGNOSIS — R921 Mammographic calcification found on diagnostic imaging of breast: Secondary | ICD-10-CM

## 2016-02-05 ENCOUNTER — Encounter: Payer: Self-pay | Admitting: Internal Medicine

## 2016-02-05 ENCOUNTER — Ambulatory Visit (INDEPENDENT_AMBULATORY_CARE_PROVIDER_SITE_OTHER): Payer: BLUE CROSS/BLUE SHIELD | Admitting: Internal Medicine

## 2016-02-05 VITALS — BP 108/72 | HR 87 | Temp 98.3°F | Resp 12 | Wt 171.0 lb

## 2016-02-05 DIAGNOSIS — E0789 Other specified disorders of thyroid: Secondary | ICD-10-CM

## 2016-02-05 DIAGNOSIS — R233 Spontaneous ecchymoses: Secondary | ICD-10-CM

## 2016-02-05 DIAGNOSIS — E0590 Thyrotoxicosis, unspecified without thyrotoxic crisis or storm: Secondary | ICD-10-CM | POA: Insufficient documentation

## 2016-02-05 DIAGNOSIS — R238 Other skin changes: Secondary | ICD-10-CM | POA: Diagnosis not present

## 2016-02-05 LAB — T3, FREE: T3 FREE: 4 pg/mL (ref 2.3–4.2)

## 2016-02-05 LAB — TSH: TSH: 0.08 u[IU]/mL — ABNORMAL LOW (ref 0.35–4.50)

## 2016-02-05 LAB — T4, FREE: FREE T4: 1.16 ng/dL (ref 0.60–1.60)

## 2016-02-05 NOTE — Patient Instructions (Signed)
Please stop at the lab.  Please return in 1 year.  

## 2016-02-05 NOTE — Progress Notes (Signed)
Patient ID: Erica Rojas, female   DOB: [birth year], 55 y.o.   MRN: [id number]   HPI  Erica Rojas is a 55 y.o.-year-old female, returning for f/u for subclinical thyrotoxicosis. Last visit 6 mo ago.   Reviewed hx: She was seen by Dr Henderson Cloud (obGyn) >> screening TSH was low. Retrospectively, she had: mm and joint pain (neck, shoulders), also confusion, lack of concentration >> became worse >> went to walk-in clinic >> given an inj of steroid >> pain better >> then got in to see PCP >> checked TSH >> still low >> referred to endocrinology (03/2015).  Since then, TFTs remain abnormal, however, her symptoms resolved. A thyroid uptake and scan was normal, as was a thyroid ultrasound, except heterogeneous appearance of her thyroid lobes.  I reviewed pt's thyroid tests: Component     Latest Ref Rng 08/07/2015  TSH     0.35 - 4.50 uIU/mL 0.07 (L)  T4,Free(Direct)     0.60 - 1.60 ng/dL 9.79  Triiodothyronine,Free,Serum     2.3 - 4.2 pg/mL 3.8    Previously: Component     Latest Ref Rng 03/20/2014 06/20/2014 09/23/2014 04/03/2015  TSH     0.35 - 4.50 uIU/mL 0.01 (L) 0.02 (L) 0.03 (L) 0.06 (L)  Free T4     0.60 - 1.60 ng/dL 4.99 7.18 2.09 9.06   89/34/0684: TSH 0.02, fT4 1.59 (0.61-1.12), fT3 4.48 (2.5-3.9); ESR 15; CRP 2.6 02/17/2014: TSH 0.016  01/15/2013: TSH 1.462  Uptake and scan (07/08/2014): 29.3% uptake (nl 10-30%), uniform  Thyroid ultrasound (08/14/2015):   Right thyroid lobe: 3.7 cm x 1.3 cm x 1.6 cm. Heterogeneous appearance of the right thyroid tissue without significantly increased flow.  Left thyroid lobe: 3.3 cm x 1.5 cm x 1.6 cm. Heterogeneous appearance of left thyroid tissue without significantly increased flow.  Isthmus Thickness: 2 mm. No nodules visualized.  Lymphadenopathy: None visualized.  IMPRESSION: Heterogeneous appearance of the thyroid without focal nodules. Appearance may represent spectrum of thyroiditis.  Pt denies feeling nodules in neck,  hoarseness, dysphagia/odynophagia, SOB with lying down.  She was on Biotin, now off.  She c/o: -+ slight weight gain (5 lbs by our scale) - no heat intolerance (hysterectomy 2014), mostly at night >> sweating, also - no tremors - improved anxiety - no palpitations  - no fatigue - no easy bruising  I reviewed pt's medications, allergies, PMH, social hx, family hx, and changes were documented in the history of present illness. Otherwise, unchanged from my initial visit note.  Daughter attempted suicide in 06/2014. She is now better, lives with pt.  ROS: Constitutional: see HPI Eyes: no blurry vision, no xerophthalmia ENT: no sore throat, no nodules palpated in throat, no dysphagia/odynophagia, no hoarseness Cardiovascular: no CP/SOB/palpitations/leg swelling Respiratory: no cough/SOB Gastrointestinal: no N/V/D/C/heartburn Musculoskeletal: no muscle/joint aches Skin: no rashes, no easy bruising Neurological: no tremors/numbness/tingling/dizziness, HA  PE: BP 108/72 mmHg  Pulse 87  Temp(Src) 98.3 F (36.8 C) (Oral)  Resp 12  Wt 171 lb (77.565 kg)  SpO2 97% Body mass index is 35.13 kg/(m^2).  Wt Readings from Last 3 Encounters:  02/05/16 171 lb (77.565 kg)  08/07/15 166 lb (75.297 kg)  03/25/15 164 lb (74.39 kg)   Constitutional: overweight, in NAD Eyes: PERRLA, EOMI, no exophthalmos, no lid lag, no stare ENT: moist mucous membranes, L>R thyroid fullness , no cervical lymphadenopathy Cardiovascular: RRR, No MRG Respiratory: CTA B Gastrointestinal: abdomen soft, NT, ND, BS+ Musculoskeletal: no deformities, strength intact in all 4 Skin: moist,  warm, no rashes, + easy bruising Neurological: no tremor with outstretched hands, DTR normal in all 4  ASSESSMENT: 1. Subclinical hyperthyroidism - 07/08/2014: Upper normal thyroid uptake (29.3%) and uniform scan. - likely very mild Graves disease  2. Left thyroid fullness -  No Thyroid nodules  3. Easy bruising  PLAN:   1. Patient h/o mild thyrotoxicosis, with some thyrotoxic sxs :hyperthermia, anxiety occasional palpitations and tremors, now all resolved.   - I reviewed previous TFTs along with the patient: Free thyroid hormones are normal, while the TSH is slowly improving but still low. Will recheck TFTs today. - we held off adding methimazole, based on the nonspecific symptoms, improving thyroid tests, and normal uptake and scan. - At this visit, she reports feeling at baseline, so no need for methimazole, but will await the results of the TFTs. - RTC in 1 year for another visit   2. 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 Easy bruising - she takes 2x 1000 mg Fish oil + 81 mg ASA daily >> advised her to decrease Fish oil dose to 1000 mg (she is using it as a supplement, not for HTG). Reviewed TG from 2015 >> normal.  Office Visit on 02/05/2016  Component Date Value Ref Range Status  . T3, Free 02/05/2016 4.0  2.3 - 4.2 pg/mL Final  . Free T4 02/05/2016 1.16  0.60 - 1.60 ng/dL Final  . TSH 02/05/2016 0.08* 0.35 - 4.50 uIU/mL Final   TFTs stable (TSH still as low as before).

## 2016-04-25 DIAGNOSIS — Z01419 Encounter for gynecological examination (general) (routine) without abnormal findings: Secondary | ICD-10-CM | POA: Diagnosis not present

## 2016-04-25 DIAGNOSIS — Z6837 Body mass index (BMI) 37.0-37.9, adult: Secondary | ICD-10-CM | POA: Diagnosis not present

## 2016-05-12 DIAGNOSIS — Z1321 Encounter for screening for nutritional disorder: Secondary | ICD-10-CM | POA: Diagnosis not present

## 2016-05-12 DIAGNOSIS — Z13220 Encounter for screening for lipoid disorders: Secondary | ICD-10-CM | POA: Diagnosis not present

## 2016-05-12 DIAGNOSIS — Z13228 Encounter for screening for other metabolic disorders: Secondary | ICD-10-CM | POA: Diagnosis not present

## 2016-05-12 DIAGNOSIS — Z13820 Encounter for screening for osteoporosis: Secondary | ICD-10-CM | POA: Diagnosis not present

## 2016-07-07 DIAGNOSIS — Z23 Encounter for immunization: Secondary | ICD-10-CM | POA: Diagnosis not present

## 2016-07-22 DIAGNOSIS — H524 Presbyopia: Secondary | ICD-10-CM | POA: Diagnosis not present

## 2016-07-22 DIAGNOSIS — H2513 Age-related nuclear cataract, bilateral: Secondary | ICD-10-CM | POA: Diagnosis not present

## 2016-07-22 DIAGNOSIS — H40033 Anatomical narrow angle, bilateral: Secondary | ICD-10-CM | POA: Diagnosis not present

## 2016-10-24 DIAGNOSIS — K080 Exfoliation of teeth due to systemic causes: Secondary | ICD-10-CM | POA: Diagnosis not present

## 2016-12-22 DIAGNOSIS — G43009 Migraine without aura, not intractable, without status migrainosus: Secondary | ICD-10-CM | POA: Diagnosis not present

## 2017-01-12 IMAGING — MG MM DIAG BREAST TOMO UNI RIGHT
4 series · 4 of 12 positions shown · non-contrast
Comparison: Previous exam(s).

ADDENDUM:
The composition of the bilateral breast is category c:
Heterogeneously dense fibroglandular tissue.
CLINICAL DATA: Six-month follow-up for a probably benign right
breast mass.

EXAM:
DIGITAL DIAGNOSTIC TOMOSYNTHESIS MAMMOGRAM OF THE RIGHT BREAST
ULTRASOUND OF THE RIGHT BREAST

[R MLO]
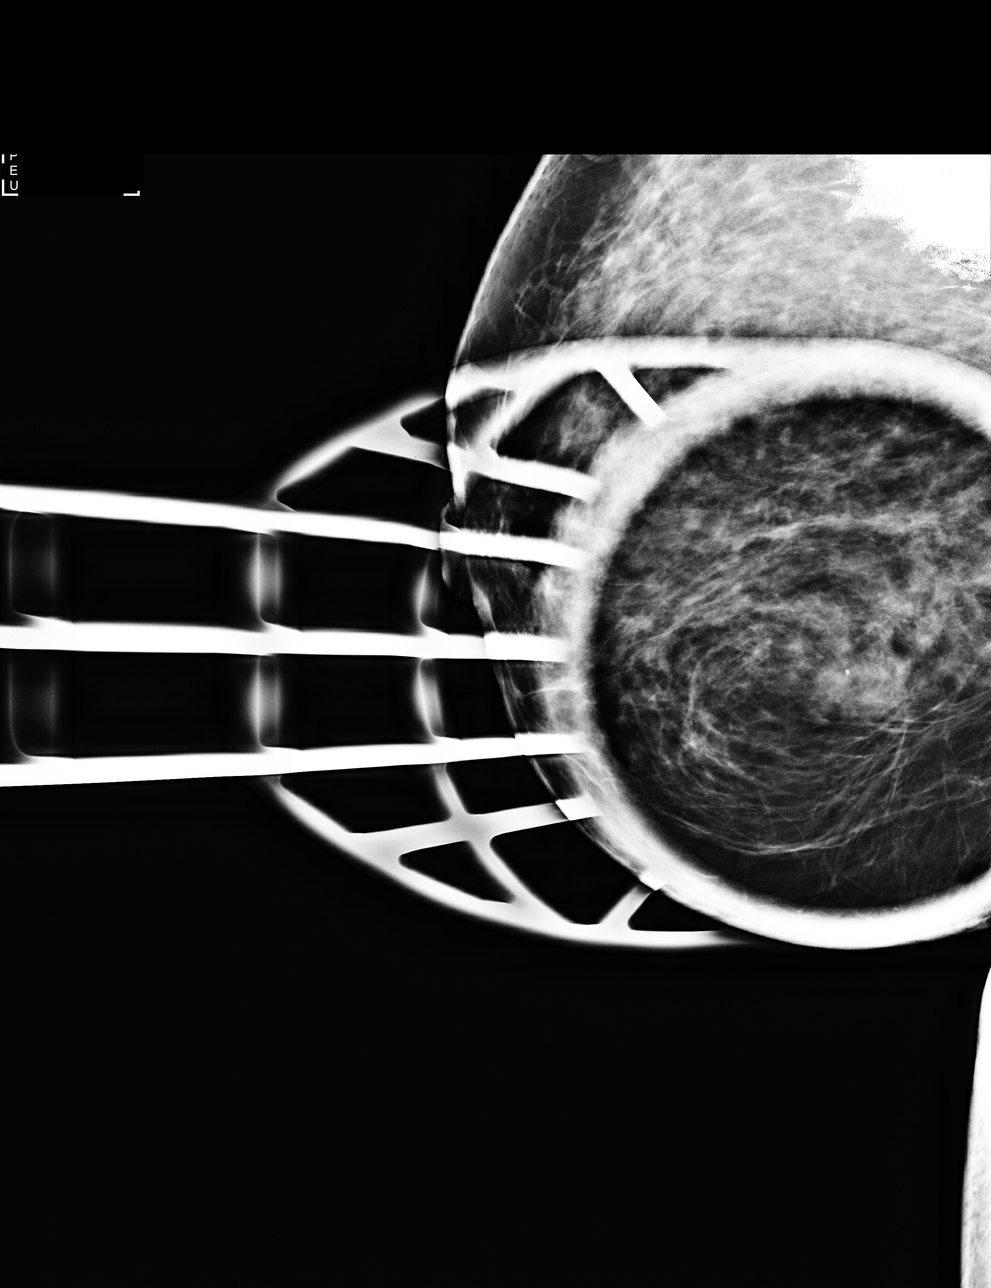

[R CC]
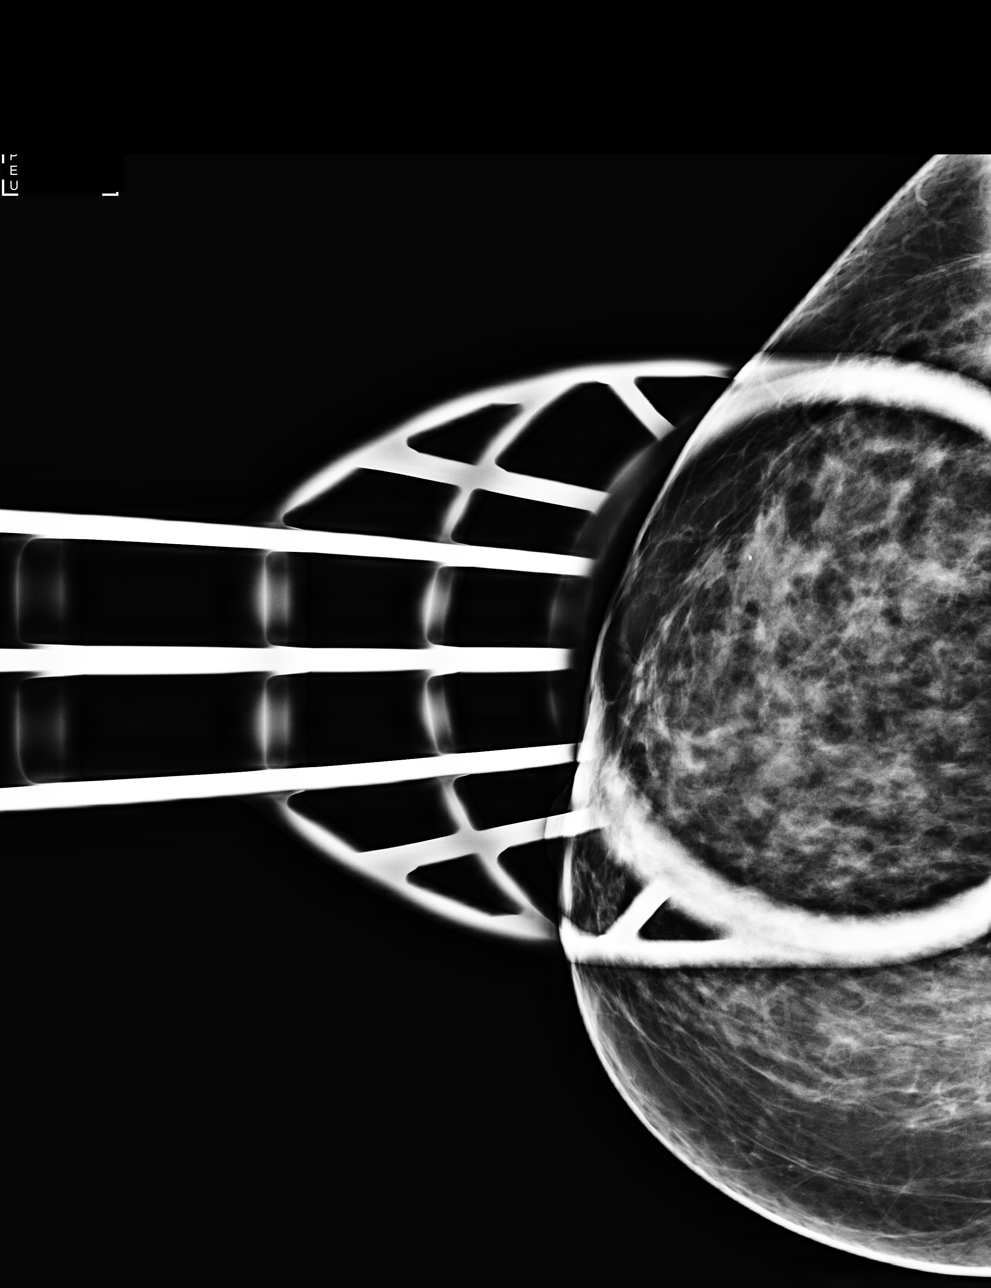

[R MLO tomo · tomo slice 33/65.0]
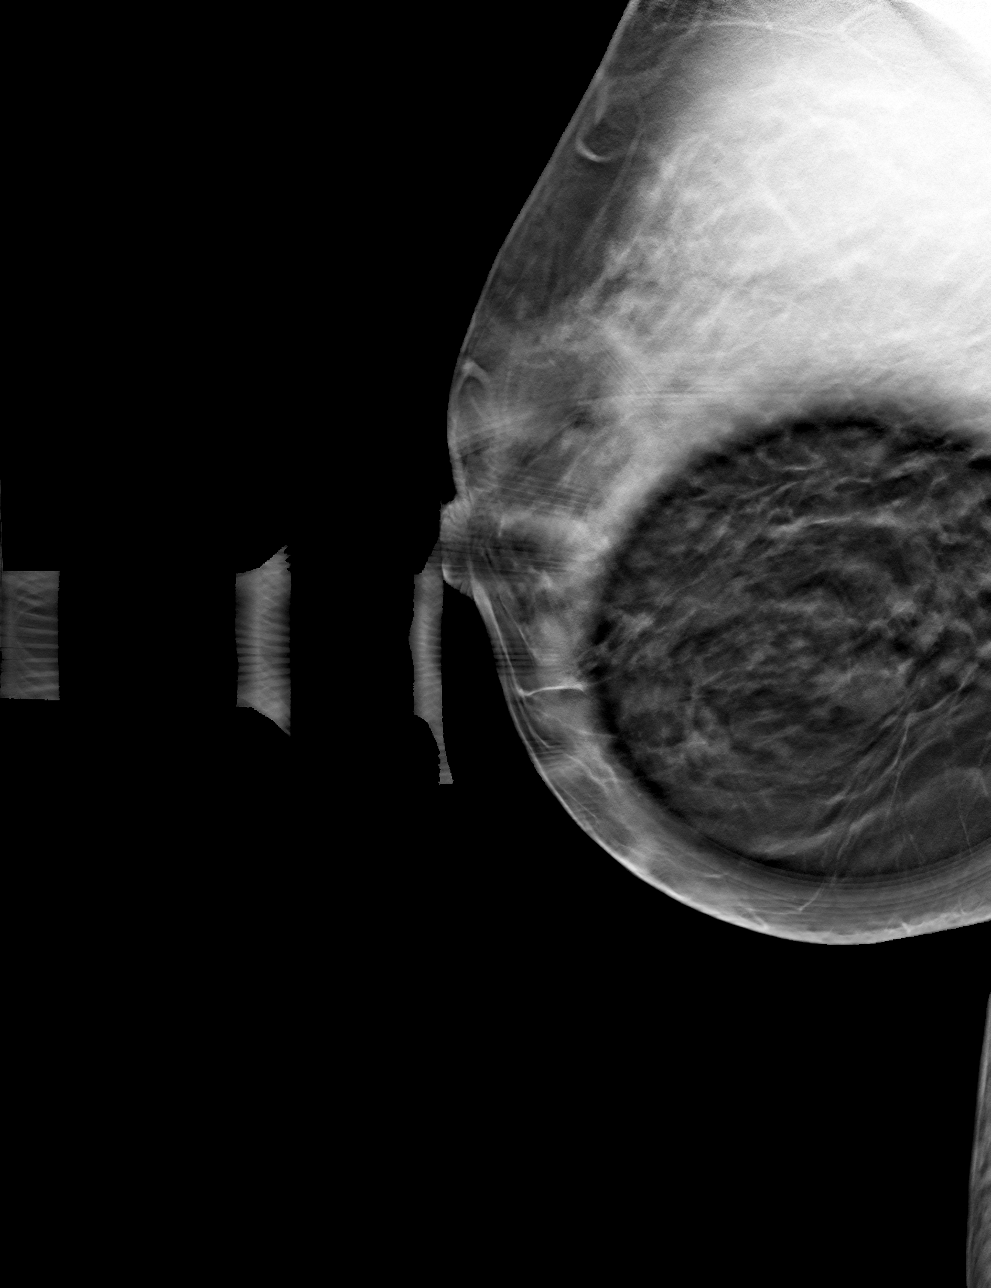

[R CC tomo · tomo slice 31/62.0]
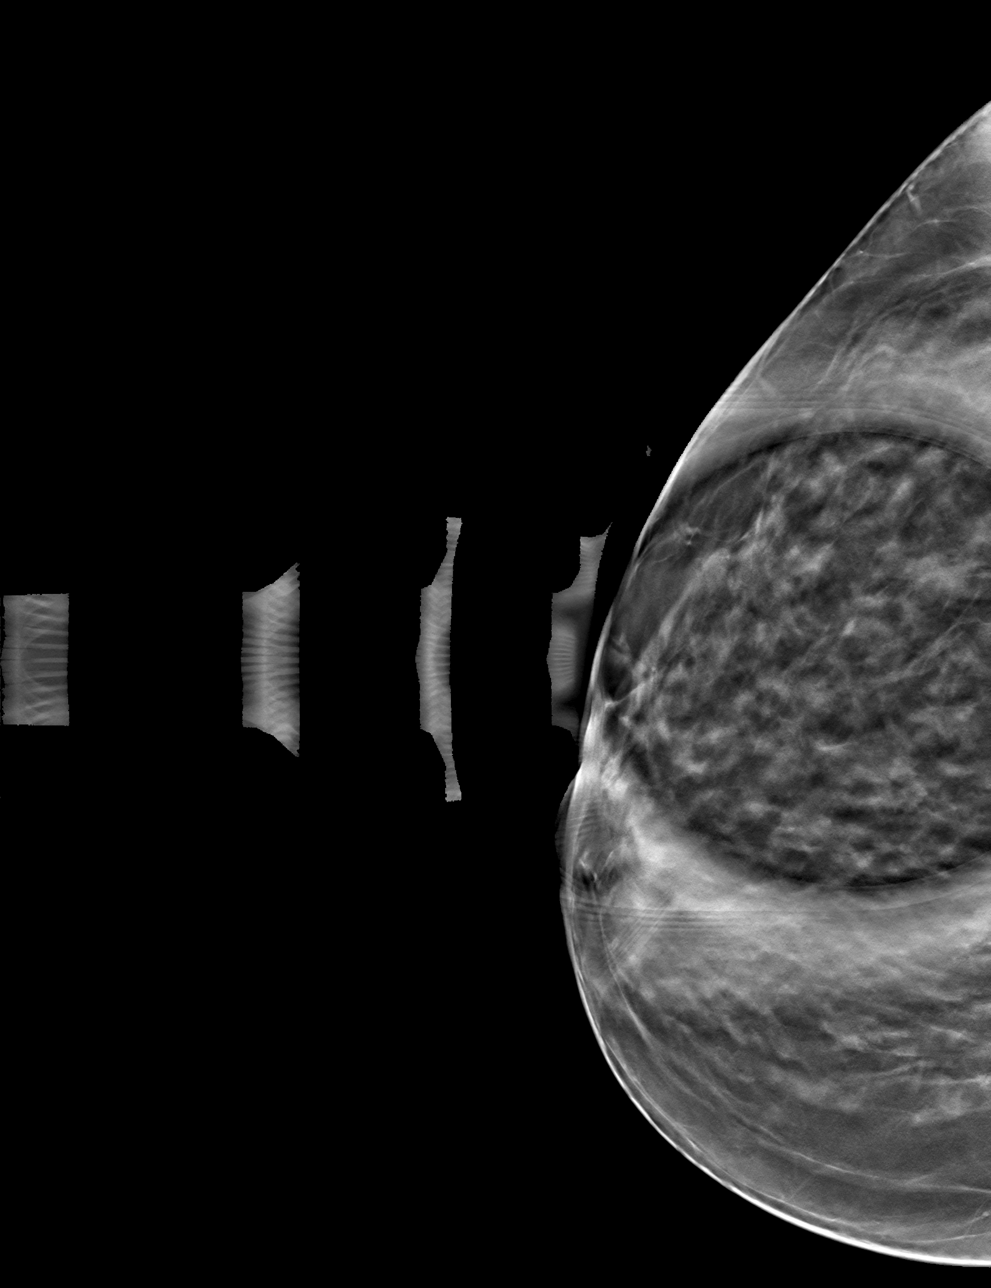

[4 of 12 positions shown; findings below may reference images not displayed]

FINDINGS: The asymmetry in the 8 o'clock position of the right breast is
stable. No suspicious calcifications, masses or areas of distortion
are seen in the bilateral breasts.

Mammographic images were processed with CAD.

On physical exam, no discrete palpable abnormality is identified in
the 8 o'clock position of the right breast.

Targeted ultrasound is performed, showing normal fibroglandular
tissue. The mass of concern previously imaged is not discretely
identified.
IMPRESSION: 1. The asymmetry in the right breast is stable mammographically, and
is not visualized sonographically. This remains probably benign.

2.  No mammographic evidence of right breast malignancy.

RECOMMENDATION:
Six-month follow-up bilateral diagnostic mammogram is recommended to
ensure continued stability of the probably benign right breast
asymmetry. Ultrasound may possibly be performed.

I have discussed the findings and recommendations with the patient.
Results were also provided in writing at the conclusion of the
visit. If applicable, a reminder letter will be sent to the patient
regarding the next appointment.

BI-RADS CATEGORY  3: Probably benign finding(s) - short interval
follow-up suggested.

## 2017-02-03 ENCOUNTER — Ambulatory Visit: Payer: BLUE CROSS/BLUE SHIELD | Admitting: Internal Medicine

## 2017-05-03 DIAGNOSIS — I788 Other diseases of capillaries: Secondary | ICD-10-CM | POA: Diagnosis not present

## 2017-05-03 DIAGNOSIS — D485 Neoplasm of uncertain behavior of skin: Secondary | ICD-10-CM | POA: Diagnosis not present

## 2017-05-03 DIAGNOSIS — L821 Other seborrheic keratosis: Secondary | ICD-10-CM | POA: Diagnosis not present

## 2017-05-03 DIAGNOSIS — L918 Other hypertrophic disorders of the skin: Secondary | ICD-10-CM | POA: Diagnosis not present

## 2017-05-03 DIAGNOSIS — L814 Other melanin hyperpigmentation: Secondary | ICD-10-CM | POA: Diagnosis not present

## 2017-05-03 DIAGNOSIS — L570 Actinic keratosis: Secondary | ICD-10-CM | POA: Diagnosis not present

## 2017-05-03 DIAGNOSIS — D2272 Melanocytic nevi of left lower limb, including hip: Secondary | ICD-10-CM | POA: Diagnosis not present

## 2017-05-08 ENCOUNTER — Encounter: Payer: Self-pay | Admitting: Internal Medicine

## 2017-05-08 ENCOUNTER — Ambulatory Visit (INDEPENDENT_AMBULATORY_CARE_PROVIDER_SITE_OTHER): Payer: BLUE CROSS/BLUE SHIELD | Admitting: Internal Medicine

## 2017-05-08 VITALS — BP 118/82 | HR 67 | Wt 183.0 lb

## 2017-05-08 DIAGNOSIS — E0789 Other specified disorders of thyroid: Secondary | ICD-10-CM

## 2017-05-08 DIAGNOSIS — E0590 Thyrotoxicosis, unspecified without thyrotoxic crisis or storm: Secondary | ICD-10-CM | POA: Diagnosis not present

## 2017-05-08 LAB — T3, FREE: T3, Free: 3.1 pg/mL (ref 2.3–4.2)

## 2017-05-08 LAB — TSH: TSH: 0.49 u[IU]/mL (ref 0.35–4.50)

## 2017-05-08 LAB — T4, FREE: Free T4: 0.97 ng/dL (ref 0.60–1.60)

## 2017-05-08 NOTE — Patient Instructions (Signed)
Please stop at the lab.  Please return as needed. 

## 2017-05-08 NOTE — Progress Notes (Signed)
Patient ID: Erica Rojas, female   DOB: January 06, 1961, 56 y.o.   MRN: [id number]   HPI  Erica Rojas is a 56 y.o.-year-old female, returning for f/u for subclinical thyrotoxicosis. Last visit 1 year and 3 mo ago.  Reviewed and addended hx: She was prev. seen by Dr Gaetano Net (obGyn) in 2015 >> screening TSH was low. Retrospectively, she had: mm and joint pain (neck, shoulders), also confusion, lack of concentration >> became worse >> went to walk-in clinic >> given an inj of steroid >> pain better >> then got in to see PCP >> checked TSH >> still low >> referred to endocrinology (03/2015).  Since then, TFTs remain abnormal, but her sxs resolved. A thyroid uptake and scan and a thyroid U/S were normal, except heterogeneous appearance of her thyroid.  I reviewed pt's thyroid tests: Lab Results  Component Value Date   TSH 0.08 (L) 02/05/2016   TSH 0.07 (L) 08/07/2015   TSH 0.06 (L) 04/03/2015   TSH 0.03 (L) 09/23/2014   TSH 0.02 (L) 06/20/2014   TSH 0.01 (L) 03/20/2014   FREET4 1.16 02/05/2016   FREET4 1.20 08/07/2015   FREET4 1.18 04/03/2015   FREET4 1.20 09/23/2014   FREET4 1.15 06/20/2014   FREET4 1.55 03/20/2014   T3FREE 4.0 02/05/2016   T3FREE 3.8 08/07/2015   T3FREE 4.2 09/23/2014   T3FREE 3.4 06/20/2014   T3FREE 3.9 03/20/2014   Prev: 03/12/2014: TSH 0.02, fT4 1.59 (0.61-1.12), fT3 4.48 (2.5-3.9); ESR 15; CRP 2.6 02/17/2014: TSH 0.016  01/15/2013: TSH 1.462  Lab Results  Component Value Date   TSI 118 03/20/2014   Uptake and scan (07/08/2014): 29.3% uptake (nl 10-30%), uniform  Thyroid ultrasound (08/14/2015):   Right thyroid lobe: 3.7 cm x 1.3 cm x 1.6 cm. Heterogeneous appearance of the right thyroid tissue without significantly increased flow.  Left thyroid lobe: 3.3 cm x 1.5 cm x 1.6 cm. Heterogeneous appearance of left thyroid tissue without significantly increased flow.  Isthmus Thickness: 2 mm. No nodules visualized.  Lymphadenopathy: None  visualized.  IMPRESSION: Heterogeneous appearance of the thyroid without focal nodules. Appearance may represent spectrum of thyroiditis.  Pt denies feeling nodules in neck, hoarseness, dysphagia/odynophagia, SOB with lying down.  She was on Biotin, now off.  She continues to be asymptomatic.  Pt denies: - weight loss >> actually gained weight (12 lbs in last 1 year and 3 mo) - heat intolerance - tremors - palpitations - anxiety - hyperdefecation - hair loss  Daughter attempted suicide in 06/2014. She is now better, lives with pt.  ROS: Constitutional: + see HPI,  + nocturia Eyes: no blurry vision, no xerophthalmia ENT: no sore throat, no nodules palpated in throat, no dysphagia, no odynophagia, no hoarseness Cardiovascular: no CP/no SOB/no palpitations/no leg swelling Respiratory: no cough/no SOB/no wheezing Gastrointestinal: no N/no V/no D/+ C/no acid reflux Musculoskeletal: no muscle aches/no joint aches Skin: no rashes, no hair loss Neurological: no tremors/no numbness/no tingling/no dizziness  I reviewed pt's medications, allergies, PMH, social hx, family hx, and changes were documented in the history of present illness. Otherwise, unchanged from my initial visit note.  PE: BP 118/82 (BP Location: Left Arm, Patient Position: Sitting)   Pulse 67   Wt 183 lb (83 kg)   SpO2 96%   BMI 37.60 kg/m  Body mass index is 37.6 kg/m.  Wt Readings from Last 3 Encounters:  05/08/17 183 lb (83 kg)  02/05/16 171 lb (77.6 kg)  08/07/15 166 lb (75.3 kg)   Constitutional:  overweight, in NAD Eyes: PERRLA, EOMI, no exophthalmos ENT: moist mucous membranes, + L>R thyroid fulness, no cervical lymphadenopathy Cardiovascular: RRR, No MRG Respiratory: CTA B Gastrointestinal: abdomen soft, NT, ND, BS+ Musculoskeletal: no deformities, strength intact in all 4 Skin: moist, warm, no rashes Neurological: no tremor with outstretched hands, DTR normal in all 4  ASSESSMENT: 1.  Subclinical hyperthyroidism - Reviewed Thyroid Uptake and scan from 07/08/2014: upper normal thyroid uptake (29.3%) and uniform scan - likely very mild Graves disease  2. Left thyroid fullness  PLAN:  1. Patient h/o mild thyrotoxicosis, initially with some thyrotoxic sxs: anxiety, heat intolerance, palpitations and tremors, resolved after the initial presentation despite no treatment.She continues to be asymptomatic despite TSH levels suppressed <0.1. Reviewed together her TFTs since 2015 >> TSH increasing but extremely slowly. Free thyroid hh are normal. - we will recheck TFTs today and add thyroid Abs - if TFTs are worse >> may need low dose Methimazole. - RTC as needed >> she will be moving to [place] by the end of the year  2. Left thyroid fullness - stable on palpation - no neck compression sxs - likely rotated thyroid gland as thyroid U/S not showing any abnormalities  Office Visit on 05/08/2017  Component Date Value Ref Range Status  . TSH 05/08/2017 0.49  0.35 - 4.50 uIU/mL Final  . Free T4 05/08/2017 0.97  0.60 - 1.60 ng/dL Final   Comment: Specimens from patients who are undergoing biotin therapy and /or ingesting biotin supplements may contain high levels of biotin.  The higher biotin concentration in these specimens interferes with this Free T4 assay.  Specimens that contain high levels  of biotin may cause false high results for this Free T4 assay.  Please interpret results in light of the total clinical presentation of the patient.    . T3, Free 05/08/2017 3.1  2.3 - 4.2 pg/mL Final  . Thyroperoxidase Ab SerPl-aCnc 05/08/2017 5  <9 IU/mL Final  . Thyroglobulin Ab 05/08/2017 3* <2 IU/mL Final  . TSI 05/08/2017 <89  <140 % baseline Final   Comment: Thyroid stimulating immunoglobulins (TSI) can engage the TSH receptors resulting in hyperthyroidism in Graves' disease patients. TSI levels can be useful in monitoring the clinical outcome of Graves' disease as well as  assessing the potential for hyperthyroidism from maternal-fetal transfer. TSI results greater than or equal to (>=) 140% of the Reference Control are considered positive. NOTE: A serum TSH level greater than 350 micro-International Units/mL can interfere with the TSI bioassay and potentially give false positive results. Patients who are pregnant and are suspected of having hyperthyroidism should have both TSI and human Chorionic Gonadotropin(hCG) tests measured. A serum hCG level greater than 40,625 mIU/mL can interfere with the TSI bioassay and may give false negative results. In these patients it is recommended that a second TSI be obtained when the hCG concentration falls below 40,625 mIU/mL (usually after approximately 20-weeks gestation). The an                          alytical performance characteristics of this assay have been determined by [employer], [place], [place]. The modifications have not been cleared or approved by the FDA. This assay has been validated pursuant to the CLIA regulations and is used for clinical purposes.    Normal TFTs. Graves Ab's negative. No intervention needed.  Philemon Kingdom, MD PhD [facility] Endocrinology

## 2017-05-09 LAB — THYROID PEROXIDASE ANTIBODY: THYROID PEROXIDASE ANTIBODY: 5 [IU]/mL (ref <9)

## 2017-05-09 LAB — THYROGLOBULIN ANTIBODY: Thyroglobulin Ab: 3 IU/mL — ABNORMAL HIGH (ref <2)

## 2017-05-12 LAB — THYROID STIMULATING IMMUNOGLOBULIN

## 2017-06-05 ENCOUNTER — Other Ambulatory Visit: Payer: Self-pay | Admitting: Obstetrics and Gynecology

## 2017-06-05 DIAGNOSIS — N6489 Other specified disorders of breast: Secondary | ICD-10-CM

## 2017-06-12 ENCOUNTER — Ambulatory Visit: Payer: BLUE CROSS/BLUE SHIELD

## 2017-06-12 ENCOUNTER — Ambulatory Visit
Admission: RE | Admit: 2017-06-12 | Discharge: 2017-06-12 | Disposition: A | Discharge status: Home / Self Care | Payer: BLUE CROSS/BLUE SHIELD | Source: Ambulatory Visit | Attending: Obstetrics and Gynecology | Admitting: Obstetrics and Gynecology

## 2017-06-12 DIAGNOSIS — N6489 Other specified disorders of breast: Secondary | ICD-10-CM

## 2017-06-12 DIAGNOSIS — R922 Inconclusive mammogram: Secondary | ICD-10-CM | POA: Diagnosis not present

## 2017-06-16 DIAGNOSIS — Z01419 Encounter for gynecological examination (general) (routine) without abnormal findings: Secondary | ICD-10-CM | POA: Diagnosis not present

## 2017-06-16 DIAGNOSIS — Z6838 Body mass index (BMI) 38.0-38.9, adult: Secondary | ICD-10-CM | POA: Diagnosis not present

## 2017-08-02 DIAGNOSIS — Z13228 Encounter for screening for other metabolic disorders: Secondary | ICD-10-CM | POA: Diagnosis not present

## 2017-08-02 DIAGNOSIS — Z1321 Encounter for screening for nutritional disorder: Secondary | ICD-10-CM | POA: Diagnosis not present

## 2017-08-02 DIAGNOSIS — Z131 Encounter for screening for diabetes mellitus: Secondary | ICD-10-CM | POA: Diagnosis not present

## 2017-08-02 DIAGNOSIS — Z13220 Encounter for screening for lipoid disorders: Secondary | ICD-10-CM | POA: Diagnosis not present

## 2017-08-19 DIAGNOSIS — Z23 Encounter for immunization: Secondary | ICD-10-CM | POA: Diagnosis not present

## 2017-12-06 DIAGNOSIS — Z23 Encounter for immunization: Secondary | ICD-10-CM | POA: Diagnosis not present

## 2018-06-22 DIAGNOSIS — N958 Other specified menopausal and perimenopausal disorders: Secondary | ICD-10-CM | POA: Diagnosis not present

## 2018-06-22 DIAGNOSIS — Z13228 Encounter for screening for other metabolic disorders: Secondary | ICD-10-CM | POA: Diagnosis not present

## 2018-06-22 DIAGNOSIS — E559 Vitamin D deficiency, unspecified: Secondary | ICD-10-CM | POA: Diagnosis not present

## 2018-06-22 DIAGNOSIS — Z01419 Encounter for gynecological examination (general) (routine) without abnormal findings: Secondary | ICD-10-CM | POA: Diagnosis not present

## 2018-06-22 DIAGNOSIS — Z13820 Encounter for screening for osteoporosis: Secondary | ICD-10-CM | POA: Diagnosis not present

## 2018-06-22 DIAGNOSIS — Z8041 Family history of malignant neoplasm of ovary: Secondary | ICD-10-CM | POA: Diagnosis not present

## 2018-06-22 DIAGNOSIS — Z131 Encounter for screening for diabetes mellitus: Secondary | ICD-10-CM | POA: Diagnosis not present

## 2018-06-22 DIAGNOSIS — E039 Hypothyroidism, unspecified: Secondary | ICD-10-CM | POA: Diagnosis not present

## 2018-06-22 DIAGNOSIS — Z1231 Encounter for screening mammogram for malignant neoplasm of breast: Secondary | ICD-10-CM | POA: Diagnosis not present

## 2018-06-22 DIAGNOSIS — Z6839 Body mass index (BMI) 39.0-39.9, adult: Secondary | ICD-10-CM | POA: Diagnosis not present

## 2018-06-22 DIAGNOSIS — Z803 Family history of malignant neoplasm of breast: Secondary | ICD-10-CM | POA: Diagnosis not present

## 2018-06-22 DIAGNOSIS — E785 Hyperlipidemia, unspecified: Secondary | ICD-10-CM | POA: Diagnosis not present

## 2018-06-22 DIAGNOSIS — Z801 Family history of malignant neoplasm of trachea, bronchus and lung: Secondary | ICD-10-CM | POA: Diagnosis not present

## 2018-06-22 DIAGNOSIS — Z13220 Encounter for screening for lipoid disorders: Secondary | ICD-10-CM | POA: Diagnosis not present

## 2019-07-15 DIAGNOSIS — L255 Unspecified contact dermatitis due to plants, except food: Secondary | ICD-10-CM | POA: Diagnosis not present

## 2019-07-17 DIAGNOSIS — Z01419 Encounter for gynecological examination (general) (routine) without abnormal findings: Secondary | ICD-10-CM | POA: Diagnosis not present

## 2019-07-17 DIAGNOSIS — N952 Postmenopausal atrophic vaginitis: Secondary | ICD-10-CM | POA: Diagnosis not present

## 2019-07-17 DIAGNOSIS — Z6836 Body mass index (BMI) 36.0-36.9, adult: Secondary | ICD-10-CM | POA: Diagnosis not present

## 2019-07-17 DIAGNOSIS — Z13228 Encounter for screening for other metabolic disorders: Secondary | ICD-10-CM | POA: Diagnosis not present

## 2019-07-17 DIAGNOSIS — Z1329 Encounter for screening for other suspected endocrine disorder: Secondary | ICD-10-CM | POA: Diagnosis not present

## 2019-07-17 DIAGNOSIS — Z1321 Encounter for screening for nutritional disorder: Secondary | ICD-10-CM | POA: Diagnosis not present

## 2019-07-17 DIAGNOSIS — Z13220 Encounter for screening for lipoid disorders: Secondary | ICD-10-CM | POA: Diagnosis not present

## 2019-07-17 DIAGNOSIS — Z1231 Encounter for screening mammogram for malignant neoplasm of breast: Secondary | ICD-10-CM | POA: Diagnosis not present
